# Patient Record
Sex: Male | Born: 2000 | Race: Black or African American | Hispanic: No | Marital: Single | Smoking: Never smoker
Health system: Southern US, Community
[De-identification: ages and names within clinical notes are randomized; demographics above are authoritative.]

## PROBLEM LIST (undated history)

## (undated) ENCOUNTER — Emergency Department (HOSPITAL_COMMUNITY)

## (undated) DIAGNOSIS — F419 Anxiety disorder, unspecified: Secondary | ICD-10-CM

---
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
## 2020-09-19 ENCOUNTER — Other Ambulatory Visit: Payer: Self-pay

## 2020-09-19 ENCOUNTER — Ambulatory Visit
Admission: EM | Admit: 2020-09-19 | Discharge: 2020-09-19 | Disposition: A | Discharge status: Home / Self Care | Payer: BC Managed Care – PPO

## 2020-09-19 DIAGNOSIS — R112 Nausea with vomiting, unspecified: Secondary | ICD-10-CM

## 2020-09-19 HISTORY — DX: Anxiety disorder, unspecified: F41.9

## 2020-09-19 MED ORDER — ONDANSETRON 4 MG PO TBDP: 4.0000 mg | ORAL_TABLET | Freq: Once | ORAL | Status: DC

## 2020-09-19 MED ORDER — ONDANSETRON 8 MG PO TBDP: 8.0000 mg | ORAL_TABLET | Freq: Three times a day (TID) | ORAL | 0 refills | Status: AC | PRN

## 2020-09-19 MED ORDER — ONDANSETRON 8 MG PO TBDP
8.0000 mg | ORAL_TABLET | Freq: Once | ORAL | Status: AC
  Administered 2020-09-19: 8 mg via ORAL

## 2020-09-19 NOTE — Discharge Instructions (Signed)
Stay on bland diet today and adverse slowly tomorrow to regular food

## 2020-09-19 NOTE — ED Triage Notes (Signed)
Pt c/o feeling bloated last night, waking up feeling very hot, and has had several episodes of emesis. Pt does report eating some salmon that had been left un-refridgerated for 4-5 hours. Pt denies fever, diarrhea, nasal congestion, cough or any other symptoms.
